# Patient Record
Sex: Male | Born: 1990 | Race: White | Hispanic: Yes | Marital: Single | Smoking: Former smoker
Health system: Southern US, Community
[De-identification: ages and names within clinical notes are randomized; demographics above are authoritative.]

---
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
## 2018-07-09 DEATH — deceased

## 2018-09-27 ENCOUNTER — Emergency Department (HOSPITAL_COMMUNITY): Payer: No Typology Code available for payment source

## 2018-09-27 ENCOUNTER — Encounter (HOSPITAL_COMMUNITY): Payer: Self-pay | Admitting: Family Medicine

## 2018-09-27 ENCOUNTER — Emergency Department (HOSPITAL_COMMUNITY)
Admission: EM | Admit: 2018-09-27 | Discharge: 2018-09-27 | Disposition: A | Discharge status: Home / Self Care | Payer: No Typology Code available for payment source | Attending: Emergency Medicine | Admitting: Emergency Medicine

## 2018-09-27 DIAGNOSIS — Y999 Unspecified external cause status: Secondary | ICD-10-CM | POA: Insufficient documentation

## 2018-09-27 DIAGNOSIS — M79601 Pain in right arm: Secondary | ICD-10-CM | POA: Diagnosis present

## 2018-09-27 DIAGNOSIS — M25511 Pain in right shoulder: Secondary | ICD-10-CM | POA: Insufficient documentation

## 2018-09-27 DIAGNOSIS — Y92410 Unspecified street and highway as the place of occurrence of the external cause: Secondary | ICD-10-CM | POA: Diagnosis not present

## 2018-09-27 DIAGNOSIS — Y9389 Activity, other specified: Secondary | ICD-10-CM | POA: Diagnosis not present

## 2018-09-27 MED ORDER — METHOCARBAMOL 750 MG PO TABS: 750.0000 mg | ORAL_TABLET | Freq: Four times a day (QID) | ORAL | 0 refills | Status: DC

## 2018-09-27 MED ORDER — ACETAMINOPHEN 325 MG PO TABS
650.0000 mg | ORAL_TABLET | Freq: Once | ORAL | Status: AC
Start: 2018-09-27 — End: 2018-09-27
  Administered 2018-09-27: 650 mg via ORAL
  Filled 2018-09-27: qty 2

## 2018-09-27 MED ORDER — NAPROXEN 500 MG PO TABS: 500.0000 mg | ORAL_TABLET | Freq: Two times a day (BID) | ORAL | 0 refills | Status: AC
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
## 2018-09-27 NOTE — ED Notes (Signed)
Patient returned form XR
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
## 2024-03-06 ENCOUNTER — Other Ambulatory Visit: Payer: Self-pay

## 2024-03-06 ENCOUNTER — Emergency Department (HOSPITAL_BASED_OUTPATIENT_CLINIC_OR_DEPARTMENT_OTHER)
Admission: EM | Admit: 2024-03-06 | Discharge: 2024-03-06 | Disposition: A | Discharge status: Home / Self Care | Payer: Self-pay | Attending: Emergency Medicine | Admitting: Emergency Medicine

## 2024-03-06 ENCOUNTER — Emergency Department (HOSPITAL_BASED_OUTPATIENT_CLINIC_OR_DEPARTMENT_OTHER): Payer: Self-pay | Admitting: Radiology

## 2024-03-06 DIAGNOSIS — M7918 Myalgia, other site: Secondary | ICD-10-CM

## 2024-03-06 DIAGNOSIS — Y92410 Unspecified street and highway as the place of occurrence of the external cause: Secondary | ICD-10-CM | POA: Insufficient documentation

## 2024-03-06 DIAGNOSIS — M549 Dorsalgia, unspecified: Secondary | ICD-10-CM | POA: Diagnosis not present

## 2024-03-06 DIAGNOSIS — M542 Cervicalgia: Secondary | ICD-10-CM | POA: Insufficient documentation

## 2024-03-06 DIAGNOSIS — Z7982 Long term (current) use of aspirin: Secondary | ICD-10-CM | POA: Diagnosis not present

## 2024-03-06 MED ORDER — LIDOCAINE 5 % EX PTCH
1.0000 | MEDICATED_PATCH | CUTANEOUS | Status: DC
  Administered 2024-03-06: 1 via TRANSDERMAL
  Filled 2024-03-06: qty 1

## 2024-03-06 MED ORDER — KETOROLAC TROMETHAMINE 15 MG/ML IJ SOLN
15.0000 mg | Freq: Once | INTRAMUSCULAR | Status: AC
  Administered 2024-03-06: 15 mg via INTRAMUSCULAR
  Filled 2024-03-06: qty 1

## 2024-03-06 MED ORDER — METAXALONE 800 MG PO TABS: 800.0000 mg | ORAL_TABLET | Freq: Three times a day (TID) | ORAL | 0 refills | Status: AC
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
